# Patient Record
Sex: Male | Born: 1990 | Race: White | Hispanic: No | Marital: Single
Health system: Midwestern US, Academic
[De-identification: ages and names within clinical notes are randomized; demographics above are authoritative.]

---

## 2018-04-22 LAB — COMPREHENSIVE METABOLIC PANEL
Lab: 0.8
Lab: 0.8
Lab: 100
Lab: 108 — ABNORMAL HIGH (ref 70–105)
Lab: 117
Lab: 139
Lab: 15 — ABNORMAL HIGH (ref 0–14)
Lab: 25
Lab: 28
Lab: 3.3 — ABNORMAL LOW (ref 3.5–5.0)
Lab: 7.5
Lab: 9
Lab: 9
Lab: 9.2

## 2018-04-22 LAB — THYROID STIMULATING HORMONE-TSH: Lab: 1.3 — ABNORMAL LOW (ref 3.5–5.1)

## 2018-04-22 LAB — D-DIMER: Lab: 204

## 2018-04-25 ENCOUNTER — Ambulatory Visit: Admit: 2018-04-25 | Discharge: 2018-04-25 | Payer: BC Managed Care – PPO

## 2018-04-25 ENCOUNTER — Encounter: Admit: 2018-04-25 | Discharge: 2018-04-25 | Payer: BC Managed Care – PPO

## 2018-04-25 ENCOUNTER — Ambulatory Visit: Admit: 2018-04-25 | Discharge: 2018-04-26 | Payer: BC Managed Care – PPO

## 2018-04-25 DIAGNOSIS — I10 Essential (primary) hypertension: Principal | ICD-10-CM

## 2018-04-25 LAB — BASIC METABOLIC PANEL
Lab: 117
Lab: 12
Lab: 140 — ABNORMAL LOW (ref 4.70–6.10)
Lab: 24
Lab: 3.4 — ABNORMAL LOW (ref 3.5–5.1)
Lab: 88
Lab: 9.6

## 2018-04-25 LAB — CBC: Lab: 14 — ABNORMAL HIGH (ref 4.8–10.8)

## 2018-04-30 ENCOUNTER — Encounter: Admit: 2018-04-30 | Discharge: 2018-04-30 | Payer: BC Managed Care – PPO

## 2018-05-01 ENCOUNTER — Encounter: Admit: 2018-05-01 | Discharge: 2018-05-01 | Payer: BC Managed Care – PPO

## 2018-05-01 DIAGNOSIS — I10 Essential (primary) hypertension: ICD-10-CM

## 2018-05-01 DIAGNOSIS — I429 Cardiomyopathy, unspecified: ICD-10-CM

## 2018-05-01 DIAGNOSIS — J189 Pneumonia, unspecified organism: ICD-10-CM

## 2018-05-09 ENCOUNTER — Encounter: Admit: 2018-05-09 | Discharge: 2018-05-09 | Payer: BC Managed Care – PPO

## 2018-05-09 ENCOUNTER — Ambulatory Visit: Admit: 2018-05-09 | Discharge: 2018-05-10 | Payer: BC Managed Care – PPO

## 2018-05-09 DIAGNOSIS — I10 Essential (primary) hypertension: Principal | ICD-10-CM

## 2018-05-09 DIAGNOSIS — I429 Cardiomyopathy, unspecified: ICD-10-CM

## 2018-05-09 DIAGNOSIS — J189 Pneumonia, unspecified organism: ICD-10-CM

## 2018-05-09 DIAGNOSIS — G479 Sleep disorder, unspecified: ICD-10-CM

## 2018-05-09 MED ORDER — POTASSIUM CHLORIDE 10 MEQ PO CPER
10 meq | ORAL_CAPSULE | Freq: Every day | ORAL | 3 refills | 30.00000 days | Status: AC
Start: 2018-05-09 — End: 2018-05-30

## 2018-05-09 MED ORDER — CHLORTHALIDONE 25 MG PO TAB
25 mg | ORAL_TABLET | Freq: Every day | ORAL | 3 refills | Status: AC
Start: 2018-05-09 — End: ?

## 2018-05-09 MED ORDER — SPIRONOLACTONE 25 MG PO TAB
25 mg | ORAL_TABLET | Freq: Two times a day (BID) | ORAL | 3 refills | 46.00000 days | Status: AC
Start: 2018-05-09 — End: 2018-05-30

## 2018-05-09 MED ORDER — METOPROLOL SUCCINATE 200 MG PO TB24
200 mg | ORAL_TABLET | Freq: Every day | ORAL | 3 refills | 90.00000 days | Status: AC
Start: 2018-05-09 — End: 2019-07-14

## 2018-05-10 ENCOUNTER — Encounter: Admit: 2018-05-10 | Discharge: 2018-05-10 | Payer: BC Managed Care – PPO

## 2018-05-27 LAB — BASIC METABOLIC PANEL
Lab: 10 U/L — ABNORMAL HIGH (ref 25–110)
Lab: 110 MMOL/L — ABNORMAL LOW (ref 21–30)
Lab: 136 mg/dL — ABNORMAL HIGH (ref <150)
Lab: 95 U/L — ABNORMAL HIGH (ref 7–40)
Lab: 97 g/dL — ABNORMAL LOW (ref 3.5–5.0)

## 2018-05-27 LAB — HEMOGLOBIN A1C: Lab: 5.8 mg/dL — ABNORMAL HIGH (ref >40)

## 2018-05-27 LAB — FREE T4 (FREE THYROXINE) ONLY: Lab: 1.1 g/dL — ABNORMAL HIGH (ref <130)

## 2018-05-27 LAB — THYROID STIMULATING HORMONE-TSH: Lab: 2.8 mg/dL — ABNORMAL HIGH (ref <5.0)

## 2018-05-27 LAB — MAGNESIUM: Lab: 2.1 mg/dL — ABNORMAL HIGH (ref <100)

## 2018-05-27 LAB — BNP (B-TYPE NATRIURETIC PEPTI): Lab: 16 mg/dL — ABNORMAL LOW (ref 8.5–10.6)

## 2018-05-27 LAB — LIPID PROFILE: Lab: 230 MMOL/L — ABNORMAL HIGH (ref <200)

## 2018-05-28 ENCOUNTER — Encounter: Admit: 2018-05-28 | Discharge: 2018-05-28 | Payer: BC Managed Care – PPO

## 2018-05-28 DIAGNOSIS — I429 Cardiomyopathy, unspecified: ICD-10-CM

## 2018-05-28 DIAGNOSIS — G479 Sleep disorder, unspecified: ICD-10-CM

## 2018-05-28 DIAGNOSIS — I10 Essential (primary) hypertension: Principal | ICD-10-CM

## 2018-05-30 ENCOUNTER — Ambulatory Visit: Admit: 2018-05-30 | Discharge: 2018-05-30 | Payer: BC Managed Care – PPO

## 2018-05-30 ENCOUNTER — Encounter: Admit: 2018-05-30 | Discharge: 2018-05-30 | Payer: BC Managed Care – PPO

## 2018-05-30 DIAGNOSIS — J189 Pneumonia, unspecified organism: ICD-10-CM

## 2018-05-30 DIAGNOSIS — I10 Essential (primary) hypertension: ICD-10-CM

## 2018-05-30 DIAGNOSIS — I429 Cardiomyopathy, unspecified: ICD-10-CM

## 2018-05-30 MED ORDER — SPIRONOLACTONE 25 MG PO TAB
50 mg | ORAL_TABLET | Freq: Two times a day (BID) | ORAL | 3 refills | 46.00000 days | Status: AC
Start: 2018-05-30 — End: ?

## 2018-05-30 MED ORDER — LISINOPRIL 40 MG PO TAB
40 mg | ORAL_TABLET | Freq: Two times a day (BID) | ORAL | 3 refills | Status: AC
Start: 2018-05-30 — End: ?

## 2018-07-02 LAB — BASIC METABOLIC PANEL
Lab: 1.1
Lab: 134 — ABNORMAL LOW (ref 135–146)
Lab: 24
Lab: 9.1
Lab: 93

## 2018-07-02 LAB — MAGNESIUM: Lab: 2

## 2018-07-02 LAB — BNP (B-TYPE NATRIURETIC PEPTI): Lab: 5 — ABNORMAL LOW (ref 0.40–1.08)

## 2018-07-09 ENCOUNTER — Encounter: Admit: 2018-07-09 | Discharge: 2018-07-09 | Payer: BC Managed Care – PPO

## 2018-07-09 DIAGNOSIS — I429 Cardiomyopathy, unspecified: Secondary | ICD-10-CM

## 2018-07-09 DIAGNOSIS — I10 Essential (primary) hypertension: Secondary | ICD-10-CM

## 2018-07-29 ENCOUNTER — Ambulatory Visit: Admit: 2018-07-29 | Discharge: 2018-07-29 | Payer: BC Managed Care – PPO

## 2018-07-29 DIAGNOSIS — I429 Cardiomyopathy, unspecified: Principal | ICD-10-CM

## 2018-07-29 DIAGNOSIS — I10 Essential (primary) hypertension: ICD-10-CM

## 2018-07-29 MED ORDER — GADOBENATE DIMEGLUMINE 529 MG/ML (0.1MMOL/0.2ML) IV SOLN
40 mL | Freq: Once | INTRAVENOUS | 0 refills | Status: CP
Start: 2018-07-29 — End: ?
  Administered 2018-07-29: 16:00:00 40 mL via INTRAVENOUS

## 2018-07-29 MED ORDER — SODIUM CHLORIDE 0.9 % IJ SOLN
50 mL | Freq: Once | INTRAVENOUS | 0 refills | Status: CP
Start: 2018-07-29 — End: ?
  Administered 2018-07-29: 16:00:00 50 mL via INTRAVENOUS

## 2018-07-30 ENCOUNTER — Encounter: Admit: 2018-07-30 | Discharge: 2018-07-30 | Payer: BC Managed Care – PPO

## 2018-08-22 ENCOUNTER — Encounter: Admit: 2018-08-22 | Discharge: 2018-08-22 | Payer: BC Managed Care – PPO

## 2018-08-22 ENCOUNTER — Ambulatory Visit: Admit: 2018-08-22 | Discharge: 2018-08-23 | Payer: BC Managed Care – PPO

## 2018-08-22 DIAGNOSIS — I428 Other cardiomyopathies: ICD-10-CM

## 2018-08-22 DIAGNOSIS — I10 Essential (primary) hypertension: ICD-10-CM

## 2018-08-22 DIAGNOSIS — J189 Pneumonia, unspecified organism: ICD-10-CM

## 2018-08-22 DIAGNOSIS — I429 Cardiomyopathy, unspecified: ICD-10-CM

## 2018-08-22 DIAGNOSIS — I5022 Chronic systolic (congestive) heart failure: Principal | ICD-10-CM

## 2018-08-22 NOTE — Progress Notes
Allergic/Immunologic: Negative.        Physical Exam  Constitutional: He appears well-developed and well-nourished.   HENT:  Head: Normocephalic.   Mouth/Throat: Oropharynx is clear and moist.   Eyes: Conjunctivae are normal.   Neck: Normal range of motion. No JVD present. Normal carotid pulses.  Cardiovascular: Normal rate, regular rhythm, normal heart sounds and intact distal pulses. Exam reveals no gallop and no friction rub.  No murmur heard.  Pulmonary/Chest: Effort normal and breath sounds normal. No respiratory distress. He has no wheezes. He has no rales. He exhibits no chest wall tenderness.   Abdominal: Soft, obese. Bowel sounds are normal. He exhibits no distension. There is no tenderness.   Musculoskeletal: Normal range of motion and muscular tone. He exhibits no edema with no tenderness.   Neurological: He is alert and oriented to person, place, and time. No focal deficits.  Skin: Skin is warm. No erythema.   Psychiatric: He has a normal mood and affect. Judgment, behavior, and thought content normal.       Cardiovascular Studies  Cardiac MRI 07/29/2018 showed EF 45% with normal LV size and wall thickness.  RV function was borderline normal, and there were no significant valve abnormalities seen.  Atria were of normal size and there were no resting perfusion defects.  There was no late gadolinium enhancement to suggest ischemic, inflammatory, or other infiltrative disease process.    Problems Addressed Today  Encounter Diagnoses   Name Primary?   ??? Essential hypertension Yes   ??? Chronic systolic congestive heart failure, NYHA class 2 (HCC)    ??? NICM (nonischemic cardiomyopathy) (HCC)        Assessment and Plan     Today he is normotensive and euvolemic with NYHA functional class II symptoms.  We will continue on current medications as listed below with no changes today, and he should do a better job recording his blood pressure at home, and needs a new blood pressure cuff, but otherwise we will see

## 2018-11-14 ENCOUNTER — Encounter: Admit: 2018-11-14 | Discharge: 2018-11-14 | Payer: BC Managed Care – PPO

## 2018-11-14 DIAGNOSIS — I429 Cardiomyopathy, unspecified: ICD-10-CM

## 2018-11-14 DIAGNOSIS — J189 Pneumonia, unspecified organism: ICD-10-CM

## 2018-11-14 DIAGNOSIS — I10 Essential (primary) hypertension: ICD-10-CM

## 2018-11-18 ENCOUNTER — Encounter: Admit: 2018-11-18 | Discharge: 2018-11-18

## 2018-11-18 DIAGNOSIS — I429 Cardiomyopathy, unspecified: Secondary | ICD-10-CM

## 2018-11-18 DIAGNOSIS — I10 Essential (primary) hypertension: Secondary | ICD-10-CM

## 2018-11-18 DIAGNOSIS — J189 Pneumonia, unspecified organism: Secondary | ICD-10-CM

## 2019-07-14 ENCOUNTER — Encounter: Admit: 2019-07-14 | Discharge: 2019-07-14 | Payer: BC Managed Care – PPO

## 2019-07-14 MED ORDER — METOPROLOL SUCCINATE 200 MG PO TB24
ORAL_TABLET | Freq: Every day | ORAL | 0 refills | 90.00000 days | Status: AC
Start: 2019-07-14 — End: ?

## 2020-08-25 IMAGING — CR CHEST
1 series · 1 of 1 positions shown · non-contrast
Comparison: none

[chest port x-wise]
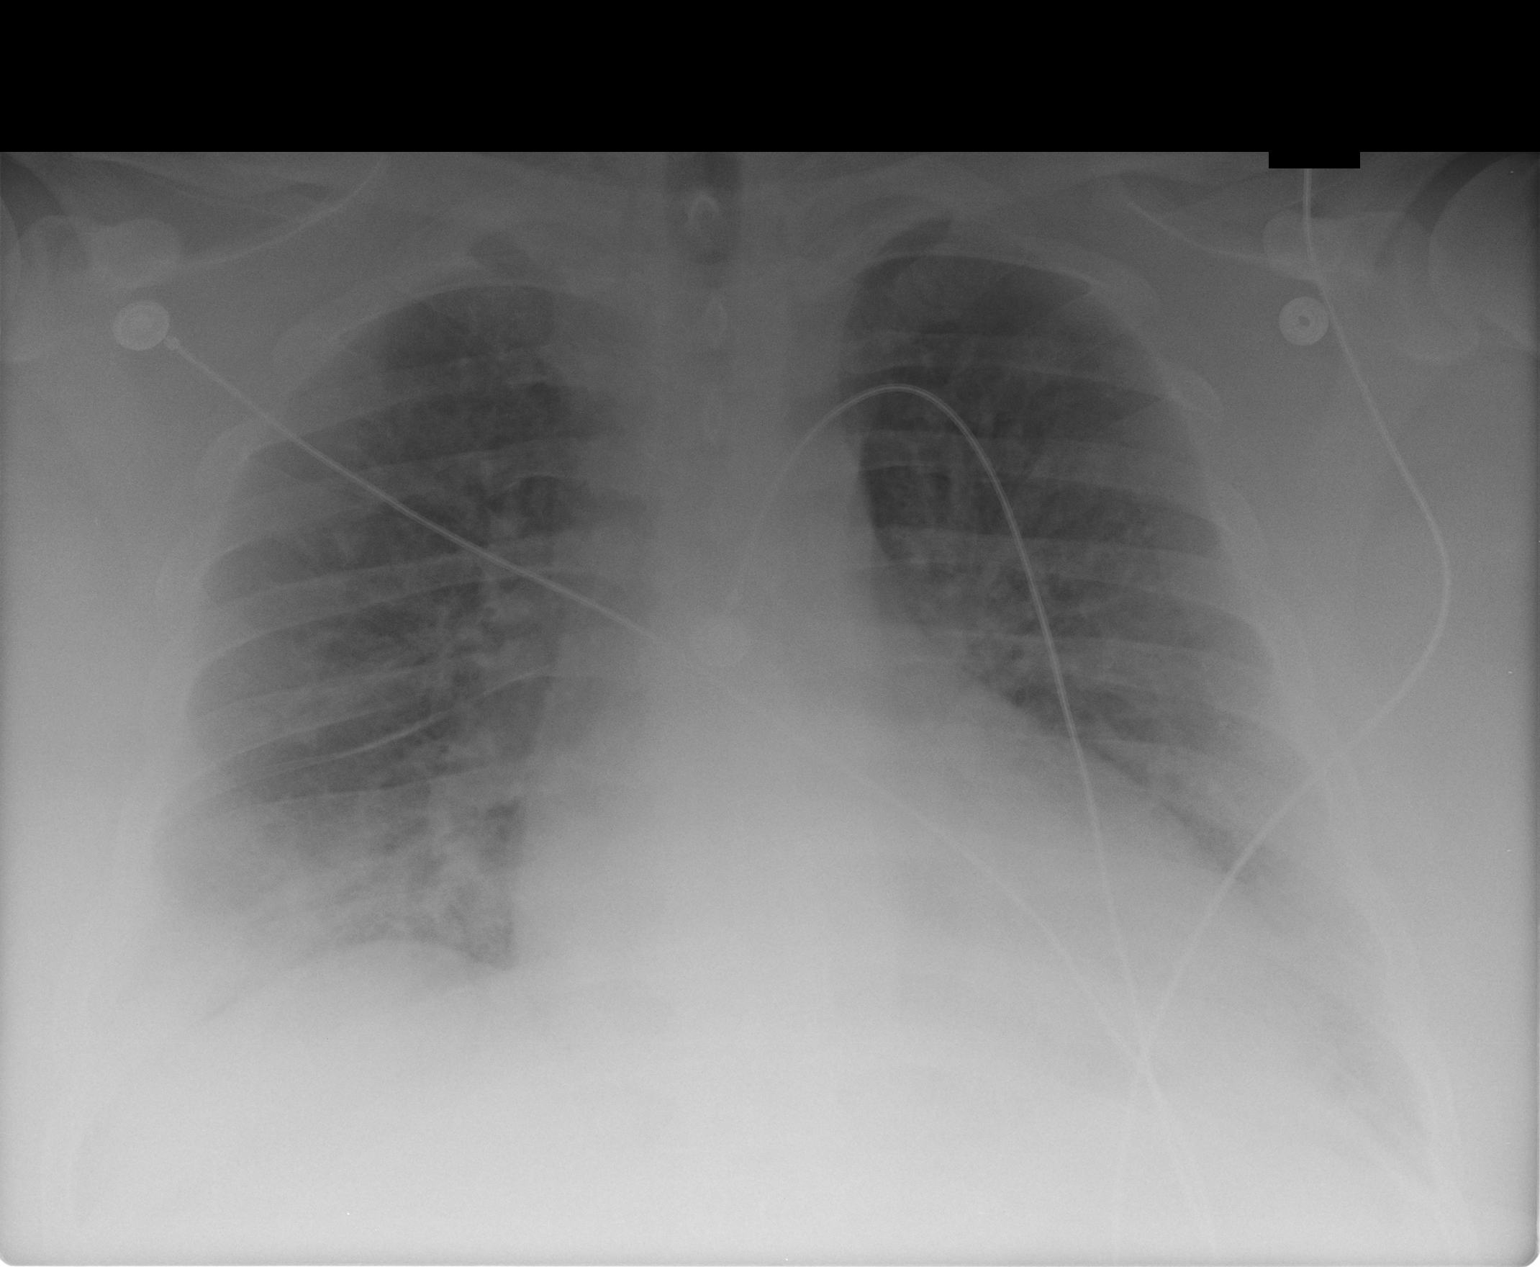

[1 of 1 positions shown; findings below may reference images not displayed]

DIAGNOSTIC STUDIES

EXAM

Chest radiograph.

INDICATION

cough, HTN- uncontrolled
pt. c/o cough and HTN. shielded

TECHNIQUE

AP chest.

COMPARISONS

None.

FINDINGS

The lungs are clear without compelling evidence of consolidation, effusion, or pneumothorax. Mild
elevation of the right hemidiaphragm. Low lung volumes with vascular crowding. No cardiomegaly.

IMPRESSION

No compelling evidence of acute cardiopulmonary pathology.

Tech Notes:

pt. c/o cough and HTN. shielded
